# Patient Record
Sex: Female | Born: 1965 | Race: White | Hispanic: No | Marital: Married
Health system: Midwestern US, Academic
[De-identification: ages and names within clinical notes are randomized; demographics above are authoritative.]

---

## 2017-07-28 ENCOUNTER — Ambulatory Visit: Admit: 2017-07-28 | Discharge: 2017-07-28 | Payer: MEDICARE

## 2017-07-28 ENCOUNTER — Encounter: Admit: 2017-07-28 | Discharge: 2017-07-28 | Payer: MEDICARE

## 2017-07-28 DIAGNOSIS — M797 Fibromyalgia: ICD-10-CM

## 2017-07-28 DIAGNOSIS — G47411 Narcolepsy with cataplexy: ICD-10-CM

## 2017-07-28 DIAGNOSIS — M1990 Unspecified osteoarthritis, unspecified site: ICD-10-CM

## 2017-07-28 DIAGNOSIS — R42 Dizziness and giddiness: ICD-10-CM

## 2017-07-28 DIAGNOSIS — F410 Panic disorder [episodic paroxysmal anxiety] without agoraphobia: ICD-10-CM

## 2017-07-28 DIAGNOSIS — J302 Other seasonal allergic rhinitis: ICD-10-CM

## 2017-07-28 DIAGNOSIS — D539 Nutritional anemia, unspecified: ICD-10-CM

## 2017-07-28 DIAGNOSIS — T148XXA Other injury of unspecified body region, initial encounter: ICD-10-CM

## 2017-07-28 DIAGNOSIS — F419 Anxiety disorder, unspecified: ICD-10-CM

## 2017-07-28 DIAGNOSIS — G4733 Obstructive sleep apnea (adult) (pediatric): ICD-10-CM

## 2017-07-28 DIAGNOSIS — K219 Gastro-esophageal reflux disease without esophagitis: ICD-10-CM

## 2017-07-28 DIAGNOSIS — H8109 Meniere's disease, unspecified ear: Principal | ICD-10-CM

## 2017-07-28 MED ORDER — ARMODAFINIL 250 MG PO TAB
250 mg | ORAL_TABLET | Freq: Every day | ORAL | 5 refills | Status: AC
Start: 2017-07-28 — End: 2018-02-06

## 2017-07-30 NOTE — Assessment & Plan Note
-  continue Xyrem 4.5 g twice a day (9 g total daily)  -Start Nuvigil 250 mg daily. Provided financial options to help reduce costs including good Rx, [employer] program, [employer] cares, and others.  -Discussed with patient the risks of operating a motor vehicle, heavy machinery, or dangerous equipment while sleep and advised to avoid until sleepiness has resolved/controlled.  -Continue to treat underlying obstructive sleep apnea.

## 2017-07-30 NOTE — Assessment & Plan Note
-  Continue CPAP therapy as prescribed.  -Adjust CPAP settings by either removing her ramp or possibly increasing her lower setting to either 9 or 10 for comfort.  --We discussed care and maintenance of machine, mask, accessories.  This includes:  -Every month: filters and mask liners (if appropriate)  -Every 3 months: new mask  -Every 6 months: new headgear, tubing, and humidifier chamber  -Discussed complications of untreated sleep apnea (predominately moderate-severe) including arrhythmia, CHF, MI, dementia, stroke, insulin resistance/DM2, and even death.  The patient voiced their understanding and all questions were answered.

## 2017-08-01 ENCOUNTER — Encounter: Admit: 2017-08-01 | Discharge: 2017-08-01 | Payer: MEDICARE

## 2017-08-01 DIAGNOSIS — D539 Nutritional anemia, unspecified: ICD-10-CM

## 2017-08-01 DIAGNOSIS — H8109 Meniere's disease, unspecified ear: Principal | ICD-10-CM

## 2017-08-01 DIAGNOSIS — T148XXA Other injury of unspecified body region, initial encounter: ICD-10-CM

## 2017-08-01 DIAGNOSIS — F410 Panic disorder [episodic paroxysmal anxiety] without agoraphobia: ICD-10-CM

## 2017-08-01 DIAGNOSIS — R42 Dizziness and giddiness: ICD-10-CM

## 2017-08-01 DIAGNOSIS — K219 Gastro-esophageal reflux disease without esophagitis: ICD-10-CM

## 2017-08-01 DIAGNOSIS — J302 Other seasonal allergic rhinitis: ICD-10-CM

## 2017-08-01 DIAGNOSIS — M797 Fibromyalgia: ICD-10-CM

## 2017-08-01 DIAGNOSIS — M1990 Unspecified osteoarthritis, unspecified site: ICD-10-CM

## 2017-08-01 DIAGNOSIS — G47411 Narcolepsy with cataplexy: ICD-10-CM

## 2017-08-01 DIAGNOSIS — F419 Anxiety disorder, unspecified: ICD-10-CM

## 2017-08-14 ENCOUNTER — Encounter: Admit: 2017-08-14 | Discharge: 2017-08-14 | Payer: MEDICARE

## 2017-08-14 MED ORDER — SODIUM OXYBATE 500 MG/ML PO SOLN
Freq: Every evening | ORAL | 5 refills | 30.00000 days | Status: AC
Start: 2017-08-14 — End: 2018-07-13

## 2017-08-14 NOTE — Telephone Encounter
-----   Message from Gardiner Barefoot, MD sent at 07/28/2017 11:17 AM CDT -----  On Xyrem, new patient for me, switch Xyrem to my  Name.

## 2017-08-14 NOTE — Telephone Encounter
Tammy MA will fax [facility] Program form to [facility] to switch pt to Dr. Andria Meuse as providing physician.

## 2017-08-15 ENCOUNTER — Encounter: Admit: 2017-08-15 | Discharge: 2017-08-15 | Payer: MEDICARE

## 2017-09-17 ENCOUNTER — Encounter: Admit: 2017-09-17 | Discharge: 2017-09-17 | Payer: MEDICARE

## 2017-09-19 ENCOUNTER — Encounter: Admit: 2017-09-19 | Discharge: 2017-09-19 | Payer: MEDICARE

## 2017-09-19 NOTE — Telephone Encounter
Tried to call patient regarding notification from Xyrem about experiencing an autoimmune disorder while taking Xyrem. I had sent an email through my chart to patient on 09/17/17, but have not heard from patient. I left a message asking the patient to call me regarding this matter.

## 2017-12-03 ENCOUNTER — Encounter: Admit: 2017-12-03 | Discharge: 2017-12-03 | Payer: MEDICARE

## 2018-01-23 ENCOUNTER — Encounter: Admit: 2018-01-23 | Discharge: 2018-01-23 | Payer: MEDICARE

## 2018-02-06 ENCOUNTER — Encounter: Admit: 2018-02-06 | Discharge: 2018-02-06 | Payer: MEDICARE

## 2018-02-06 MED ORDER — ARMODAFINIL 250 MG PO TAB
250 mg | ORAL_TABLET | Freq: Every day | ORAL | 5 refills | Status: AC
Start: 2018-02-06 — End: 2018-08-25

## 2018-03-19 ENCOUNTER — Encounter: Admit: 2018-03-19 | Discharge: 2018-03-19 | Payer: MEDICARE

## 2018-04-17 ENCOUNTER — Encounter: Admit: 2018-04-17 | Discharge: 2018-04-17 | Payer: MEDICARE

## 2018-07-09 ENCOUNTER — Encounter: Admit: 2018-07-09 | Discharge: 2018-07-09 | Payer: MEDICARE

## 2018-07-13 ENCOUNTER — Encounter: Admit: 2018-07-13 | Discharge: 2018-07-13 | Payer: MEDICARE

## 2018-07-13 MED ORDER — SODIUM OXYBATE 500 MG/ML PO SOLN
Freq: Every evening | ORAL | 5 refills | Status: AC
Start: 2018-07-13 — End: 2018-12-17

## 2018-08-25 ENCOUNTER — Encounter: Admit: 2018-08-25 | Discharge: 2018-08-25 | Payer: MEDICARE

## 2018-08-25 MED ORDER — ARMODAFINIL 250 MG PO TAB
250 mg | ORAL_TABLET | Freq: Every day | ORAL | 5 refills | Status: AC
Start: 2018-08-25 — End: 2019-03-22

## 2018-11-30 ENCOUNTER — Encounter: Admit: 2018-11-30 | Discharge: 2018-11-30 | Payer: MEDICARE

## 2018-12-14 ENCOUNTER — Encounter: Admit: 2018-12-14 | Discharge: 2018-12-14 | Payer: MEDICARE

## 2018-12-16 ENCOUNTER — Ambulatory Visit: Admit: 2018-12-16 | Discharge: 2018-12-16 | Payer: MEDICARE

## 2018-12-16 ENCOUNTER — Encounter: Admit: 2018-12-16 | Discharge: 2018-12-16 | Payer: MEDICARE

## 2018-12-16 DIAGNOSIS — M1990 Unspecified osteoarthritis, unspecified site: Secondary | ICD-10-CM

## 2018-12-16 DIAGNOSIS — T148XXA Other injury of unspecified body region, initial encounter: Secondary | ICD-10-CM

## 2018-12-16 DIAGNOSIS — G47411 Narcolepsy with cataplexy: Secondary | ICD-10-CM

## 2018-12-16 DIAGNOSIS — K219 Gastro-esophageal reflux disease without esophagitis: Secondary | ICD-10-CM

## 2018-12-16 DIAGNOSIS — D539 Nutritional anemia, unspecified: Secondary | ICD-10-CM

## 2018-12-16 DIAGNOSIS — G4733 Obstructive sleep apnea (adult) (pediatric): Secondary | ICD-10-CM

## 2018-12-16 DIAGNOSIS — F419 Anxiety disorder, unspecified: Secondary | ICD-10-CM

## 2018-12-16 DIAGNOSIS — R42 Dizziness and giddiness: Secondary | ICD-10-CM

## 2018-12-16 DIAGNOSIS — M797 Fibromyalgia: Secondary | ICD-10-CM

## 2018-12-16 DIAGNOSIS — J302 Other seasonal allergic rhinitis: Secondary | ICD-10-CM

## 2018-12-16 DIAGNOSIS — H8109 Meniere's disease, unspecified ear: Secondary | ICD-10-CM

## 2018-12-16 DIAGNOSIS — F410 Panic disorder [episodic paroxysmal anxiety] without agoraphobia: Secondary | ICD-10-CM

## 2018-12-16 MED ORDER — ZALEPLON 5 MG PO CAP
5 mg | ORAL_CAPSULE | Freq: Every evening | ORAL | 5 refills | Status: AC
Start: 2018-12-16 — End: 2019-04-15

## 2018-12-17 ENCOUNTER — Encounter: Admit: 2018-12-17 | Discharge: 2018-12-17 | Payer: MEDICARE

## 2018-12-17 MED ORDER — SODIUM OXYBATE 500 MG/ML PO SOLN
Freq: Every evening | ORAL | 3 refills | 30.00000 days | Status: AC
Start: 2018-12-17 — End: 2019-05-26

## 2019-01-28 ENCOUNTER — Encounter: Admit: 2019-01-28 | Discharge: 2019-01-28 | Payer: MEDICARE

## 2019-03-19 ENCOUNTER — Encounter: Admit: 2019-03-19 | Discharge: 2019-03-19 | Payer: MEDICARE

## 2019-03-22 MED ORDER — ARMODAFINIL 250 MG PO TAB
ORAL_TABLET | Freq: Every day | 4 refills | Status: DC
Start: 2019-03-22 — End: 2019-05-29

## 2019-04-14 ENCOUNTER — Encounter: Admit: 2019-04-14 | Discharge: 2019-04-14 | Payer: MEDICARE

## 2019-04-15 ENCOUNTER — Encounter: Admit: 2019-04-15 | Discharge: 2019-04-15 | Payer: MEDICARE

## 2019-04-15 ENCOUNTER — Ambulatory Visit: Admit: 2019-04-15 | Discharge: 2019-04-16 | Payer: MEDICARE

## 2019-04-15 DIAGNOSIS — G4733 Obstructive sleep apnea (adult) (pediatric): Principal | ICD-10-CM

## 2019-04-15 DIAGNOSIS — K219 Gastro-esophageal reflux disease without esophagitis: ICD-10-CM

## 2019-04-15 DIAGNOSIS — F410 Panic disorder [episodic paroxysmal anxiety] without agoraphobia: ICD-10-CM

## 2019-04-15 DIAGNOSIS — M1990 Unspecified osteoarthritis, unspecified site: ICD-10-CM

## 2019-04-15 DIAGNOSIS — H8109 Meniere's disease, unspecified ear: Principal | ICD-10-CM

## 2019-04-15 DIAGNOSIS — R42 Dizziness and giddiness: ICD-10-CM

## 2019-04-15 DIAGNOSIS — G47411 Narcolepsy with cataplexy: ICD-10-CM

## 2019-04-15 DIAGNOSIS — J302 Other seasonal allergic rhinitis: ICD-10-CM

## 2019-04-15 DIAGNOSIS — D539 Nutritional anemia, unspecified: ICD-10-CM

## 2019-04-15 DIAGNOSIS — M797 Fibromyalgia: ICD-10-CM

## 2019-04-15 DIAGNOSIS — F419 Anxiety disorder, unspecified: ICD-10-CM

## 2019-04-15 DIAGNOSIS — T148XXA Other injury of unspecified body region, initial encounter: ICD-10-CM

## 2019-04-15 MED ORDER — SOLRIAMFETOL 150 MG PO TAB
150 mg | ORAL_TABLET | Freq: Every morning | ORAL | 5 refills | Status: DC
Start: 2019-04-15 — End: 2020-01-20

## 2019-04-15 NOTE — Progress Notes
???   Diclofenac Sodium 3 % gel Apply to base of thumbs and balls of feet three times daily.   ??? DULoxetine DR (CYMBALTA) 60 mg capsule Take 60 mg by mouth twice daily.   ??? fluticasone propionate (FLONASE) 50 mcg/actuation nasal spray, suspension Apply 2 sprays to each nostril as directed twice daily. Shake bottle gently before using.   ??? furosemide (LASIX) 20 mg tablet Take 20 mg by mouth every morning.   ??? L.acid/L.casei/B.bif/B.lon/FOS (PROBIOTIC BLEND PO) Take  by mouth daily.   ??? MULTIVITAMIN PO Take  by mouth daily.   ??? Sodium Oxybate (XYREM) 500 mg/mL soln Take 4.5 gm at bedtime, 2-4 hours later take 4.5 gm.  Indications: Muscle Weakness associated with Sleeping Disease   ??? solriamfetoL (SUNOSI) 150 mg oral tablet Take one tablet by mouth every morning. Indications: recurring sleep episodes during the day     Vitals:    04/15/19 1044   BP: (!) 150/80   Pulse: 80   Weight: 110.7 kg (244 lb)   Height: 160 cm (63)   PainSc: Zero     Body mass index is 43.22 kg/m???.     Physical Exam    General: Alert, cooperative, voices no distress    Neurological examination:  ???  Mental status: Alert, oriented to time, person, place and situation   Speech: Normal fluency, comprehension, articulation, repetition and naming. Fund of knowledge was age appropriate.         Assessment and Plan:    *The patient's Epworth Sleepiness Scale Score is 7/24.  STOPBANG Score: 5  If score > 3 there is a high probability that they have OSA.  Depression Screening was performed on [employer] in clinic today. Based on the score of 4, I provided support in the office today via active listening and general counseling and educated her to call if symptoms worsen or seek immediate help if needed.      Problem   Narcolepsy With Cataplexy    -Xyrem 4.5 g twice a day     Osa (Obstructive Sleep Apnea)     AHI 7.5 diagnosed 2015  -Auto CPAP 8-16 cm H2O  -100% compliance with residual AHI <1            OSA (obstructive sleep apnea)

## 2019-04-15 NOTE — Assessment & Plan Note
Ms. Sherrill continues to benefit from Xyrem 4.5 grams twice per night. She is not experiencing any side effects. She is not having any difficulty falling asleep.     Armodafinil 250 mg currently is not benefiting her as she has become very tired and fatigued during the day, like a slough.    Finances are a concern. She did not want to try [facility] as she already gets Xyrem via Patient Assistance and did not want to pursue this avenue.     Start Sunosi 150 mg daily.     She will discontinue armodafinil when this starts.

## 2019-05-26 ENCOUNTER — Ambulatory Visit: Admit: 2019-05-26 | Discharge: 2019-05-27

## 2019-05-26 ENCOUNTER — Encounter: Admit: 2019-05-26 | Discharge: 2019-05-26

## 2019-05-26 DIAGNOSIS — M797 Fibromyalgia: Secondary | ICD-10-CM

## 2019-05-26 DIAGNOSIS — K219 Gastro-esophageal reflux disease without esophagitis: Secondary | ICD-10-CM

## 2019-05-26 DIAGNOSIS — F410 Panic disorder [episodic paroxysmal anxiety] without agoraphobia: Secondary | ICD-10-CM

## 2019-05-26 DIAGNOSIS — G4733 Obstructive sleep apnea (adult) (pediatric): Secondary | ICD-10-CM

## 2019-05-26 DIAGNOSIS — M1990 Unspecified osteoarthritis, unspecified site: Secondary | ICD-10-CM

## 2019-05-26 DIAGNOSIS — H8109 Meniere's disease, unspecified ear: Secondary | ICD-10-CM

## 2019-05-26 DIAGNOSIS — R42 Dizziness and giddiness: Secondary | ICD-10-CM

## 2019-05-26 DIAGNOSIS — G47411 Narcolepsy with cataplexy: Secondary | ICD-10-CM

## 2019-05-26 DIAGNOSIS — D539 Nutritional anemia, unspecified: Secondary | ICD-10-CM

## 2019-05-26 DIAGNOSIS — F419 Anxiety disorder, unspecified: Secondary | ICD-10-CM

## 2019-05-26 DIAGNOSIS — T148XXA Other injury of unspecified body region, initial encounter: Secondary | ICD-10-CM

## 2019-05-26 DIAGNOSIS — J302 Other seasonal allergic rhinitis: Secondary | ICD-10-CM

## 2019-05-26 MED ORDER — DEXTROAMPHETAMINE-AMPHETAMINE 20 MG PO TAB
20 mg | ORAL_TABLET | Freq: Two times a day (BID) | ORAL | 0 refills | Status: DC
Start: 2019-05-26 — End: 2019-07-07

## 2019-05-26 MED ORDER — TRAZODONE 50 MG PO TAB
ORAL_TABLET | 2 refills | Status: DC
Start: 2019-05-26 — End: 2019-07-07

## 2019-05-26 NOTE — Assessment & Plan Note
The patient continues to benefit from CPAP with more consolidated sleep, elimination of snoring and improved daytime alertness.    We discussed care and maintenance of machine, mask, accessories.  This includes:  Every month: filters and mask liners (if appropriate)  Every 3 months new mask  Every 6 months new headgear  Download shows compliance and effective treatment.

## 2019-05-26 NOTE — Progress Notes
Date of Service: 05/26/2019    Subjective:             Angela Melton is a 53 y.o. female.  Obtained patient's, or patient proxy's, verbal consent to treat them and their agreement to [facility] financial policy and NPP via this telehealth visit during the [employer] Emergency  Phone: 15 min    History of Present Illness  Angela Melton is a 53 year old woman with obstructive sleep apnea and narcolepsy with cataplexy who presents via a telephone encounter for a follow-up appointment. With regards to her OSA, she is doing very well. She is not having any issues with supplies and her husband has not told her about any snoring. She sometimes struggles with the fit of the mask at night and has to readjust it. With regards to her narcolepsy, since her last appointment, Xyrem was discontinued due to shakiness and tremor. Without the Xyrem, she does not feel that her sleep is as restful. Her husband has also noted more talking in her sleep and and she is having more vivid dreams. For daytime sleepiness, she was started on Sunosi and Nuvigil was discontinued. Overall, she feels like things have not improved significantly with regards to her day time sleepiness with this transition. She takes the Sunosi around 8 am and by 11am or noon, she feels like she has to take a nap. She will sleep for 45 minutes or an hour and then does okay for the rest of the day. With the Nuvigil, she would do well in the morning, but in the afternoon, she would become tired and take a much longer nap than she does around noon with the [facility]. She has never tried stimulants in the past. She does feel that she has had good improvement in her cataplexy with the Sunosi. She is now only having episodes about twice per week. She is able to tell they are coming on so she sits down and rests.        Review of Systems   Constitutional: Positive for fatigue.   Respiratory: Positive for apnea.    Neurological: Positive for syncope. Psychiatric/Behavioral: Positive for dysphoric mood.   All other systems reviewed and are negative.        Objective:         ??? ACETAMINOPHEN (TYLENOL PO) Take  by mouth.   ??? amoxicillin-potassium clavulanate (AUGMENTIN) 875/125 mg tablet Take 1 tablet by mouth every 12 hours. Take with food.   ??? armodafiniL (NUVIGIL) 250 mg tablet Take one tablet by mouth daily.   ??? dextroamphetamine-amphetamine (ADDERALL) 20 mg tablet Take one tablet by mouth twice daily Indications: recurring sleep episodes during the day   ??? diazepam (VALIUM) 5 mg tablet Take 10 mg by mouth at bedtime daily.   ??? Diclofenac Sodium 3 % gel Apply to base of thumbs and balls of feet three times daily.   ??? DULoxetine DR (CYMBALTA) 60 mg capsule Take 60 mg by mouth twice daily.   ??? fluticasone propionate (FLONASE) 50 mcg/actuation nasal spray, suspension Apply 2 sprays to each nostril as directed twice daily. Shake bottle gently before using.   ??? furosemide (LASIX) 20 mg tablet Take 20 mg by mouth every morning.   ??? L.acid/L.casei/B.bif/B.lon/FOS (PROBIOTIC BLEND PO) Take  by mouth daily.   ??? MULTIVITAMIN PO Take  by mouth daily.   ??? solriamfetoL (SUNOSI) 150 mg oral tablet Take one tablet by mouth every morning. Indications: recurring sleep episodes during the day   ???  traZODone (DESYREL) 50 mg tablet 1/2-1 po q HS     Vitals:    05/26/19 1300   Weight: 115.7 kg (255 lb)   Height: 160 cm (63)   PainSc: Ten     Body mass index is 45.17 kg/m???.     Physical Exam  This was a telephone only encounter. No physical exam was done.        Assessment and Plan:  The patient's Epworth Sleepiness Scale Score is 9/24.     If score > 3 there is a high probability that they have OSA.  Depression Screening was performed on [employer] in clinic today. Based on the score of 0, no follow up action or recommendations are necessary at this time.  Discussed patient's BMI with her.  The body mass index is 45.17 kg/m???. and falls within the category of Obesity 3 (>40); specialist visit only, referred back to Primary Care Provider for follow up.    Problem   Narcolepsy With Cataplexy    -Current medications: Sunosi 150 mg daily  -Prior medications: Xyrem 4.5 g twice a day - discontinued due to tremors. Nuvigil 250 mg daily - discontinued due to decreased efficacy.      Osa (Obstructive Sleep Apnea)     AHI 7.5 diagnosed 2015  -Auto CPAP 8-16 cm H2O  -100% compliance with residual AHI <1            OSA (obstructive sleep apnea)  The patient continues to benefit from CPAP with more consolidated sleep, elimination of snoring and improved daytime alertness.    We discussed care and maintenance of machine, mask, accessories.  This includes:  Every month: filters and mask liners (if appropriate)  Every 3 months new mask  Every 6 months new headgear  Download shows compliance and effective treatment.          Narcolepsy with cataplexy  Sunosi - increase to 150 mg - 2 tabs daily   > Has had some difficulty getting an effective coupon so if affordability continues to be an issue, will plan to transition back to Nuvigil with hope that it will be effective again after a wash-out period  Adderall - Start at 20 mg BID to help with daytime wakefulness.   Trazodone - 25-50 mg qHS to help with more restful sleep since coming off of Xyrem  Cataplexy - continue Sunosi as above    I personally discussed the case in depth with the resident, and concur with resident documentation of history, physical assessment and treatment plan unless otherwise noted.  I reviewed the key history and examination points directly with the patient.    Try Sunosi x 2 per day, as one helps, would like to increase the dose.  If that is not possible, restart armodafinil.  Adderall - Potential side effects with stimulants include: headache, tremor, palpitations (strong, rapid heart beat), irritability among others.  All of these side effects can be worsened when caffeine is used at the same time as the stimulants.        Carollee Massed, M.D., D-ABSM  Director, Sleep Medicine Clinic

## 2019-05-28 ENCOUNTER — Encounter: Admit: 2019-05-28 | Discharge: 2019-05-28

## 2019-05-29 MED ORDER — ARMODAFINIL 250 MG PO TAB
250 mg | ORAL_TABLET | Freq: Every day | ORAL | 5 refills | Status: DC
Start: 2019-05-29 — End: 2019-05-31

## 2019-05-31 ENCOUNTER — Encounter: Admit: 2019-05-31 | Discharge: 2019-05-31

## 2019-05-31 MED ORDER — ARMODAFINIL 250 MG PO TAB
250 mg | ORAL_TABLET | Freq: Every day | ORAL | 5 refills | Status: DC
Start: 2019-05-31 — End: 2019-12-15

## 2019-05-31 NOTE — Telephone Encounter
Phone call from pt requesting Nuvigil be called to a different pharmacy due to cost. She is wanting Nuvigil called into [facility] in [place].  Refill to be routed to Dr Gerilyn Nestle for approval.

## 2019-06-06 ENCOUNTER — Encounter: Admit: 2019-06-06 | Discharge: 2019-06-06

## 2019-06-06 DIAGNOSIS — K219 Gastro-esophageal reflux disease without esophagitis: Secondary | ICD-10-CM

## 2019-06-06 DIAGNOSIS — H8109 Meniere's disease, unspecified ear: Secondary | ICD-10-CM

## 2019-06-06 DIAGNOSIS — T148XXA Other injury of unspecified body region, initial encounter: Secondary | ICD-10-CM

## 2019-06-06 DIAGNOSIS — F419 Anxiety disorder, unspecified: Secondary | ICD-10-CM

## 2019-06-06 DIAGNOSIS — D539 Nutritional anemia, unspecified: Secondary | ICD-10-CM

## 2019-06-06 DIAGNOSIS — F410 Panic disorder [episodic paroxysmal anxiety] without agoraphobia: Secondary | ICD-10-CM

## 2019-06-06 DIAGNOSIS — G47411 Narcolepsy with cataplexy: Secondary | ICD-10-CM

## 2019-06-06 DIAGNOSIS — J302 Other seasonal allergic rhinitis: Secondary | ICD-10-CM

## 2019-06-06 DIAGNOSIS — M797 Fibromyalgia: Secondary | ICD-10-CM

## 2019-06-06 DIAGNOSIS — M1990 Unspecified osteoarthritis, unspecified site: Secondary | ICD-10-CM

## 2019-06-06 DIAGNOSIS — R42 Dizziness and giddiness: Secondary | ICD-10-CM

## 2019-06-25 ENCOUNTER — Encounter: Admit: 2019-06-25 | Discharge: 2019-06-25

## 2019-06-25 MED ORDER — TRAZODONE 50 MG PO TAB
ORAL_TABLET | Freq: Every evening | 1 refills
Start: 2019-06-25 — End: ?

## 2019-06-25 NOTE — Telephone Encounter
Refill request for Trazodone. Per chart refilled prescriptions on 05/26/2019 with refills. This refill DENIED.

## 2019-07-06 ENCOUNTER — Encounter: Admit: 2019-07-06 | Discharge: 2019-07-06

## 2019-07-07 MED ORDER — TRAZODONE 50 MG PO TAB
ORAL_TABLET | 2 refills | Status: DC
Start: 2019-07-07 — End: 2019-11-17

## 2019-07-07 MED ORDER — DEXTROAMPHETAMINE-AMPHETAMINE 20 MG PO TAB
20 mg | ORAL_TABLET | Freq: Two times a day (BID) | ORAL | 0 refills | Status: DC
Start: 2019-07-07 — End: 2019-11-21

## 2019-07-07 MED ORDER — DEXTROAMPHETAMINE-AMPHETAMINE 20 MG PO TAB
20 mg | ORAL_TABLET | Freq: Two times a day (BID) | ORAL | 0 refills | Status: DC
Start: 2019-07-07 — End: 2020-04-06

## 2019-07-07 MED ORDER — DEXTROAMPHETAMINE-AMPHETAMINE 20 MG PO TAB
20 mg | ORAL_TABLET | Freq: Two times a day (BID) | ORAL | 0 refills | Status: DC
Start: 2019-07-07 — End: 2020-02-08

## 2019-11-17 ENCOUNTER — Encounter: Admit: 2019-11-17 | Discharge: 2019-11-17 | Payer: MEDICARE

## 2019-11-17 MED ORDER — TRAZODONE 50 MG PO TAB
ORAL_TABLET | Freq: Every evening | 2 refills | Status: DC
Start: 2019-11-17 — End: 2020-05-11

## 2019-11-17 NOTE — Telephone Encounter
Refill request for Trazodone. Per protocol refill approved and sent to pharmacy

## 2019-11-19 ENCOUNTER — Encounter: Admit: 2019-11-19 | Discharge: 2019-11-19 | Payer: MEDICARE

## 2019-11-19 NOTE — Telephone Encounter
Refill request for Adderall. Per protocol refill routed to Dr Stevens for approval.

## 2019-11-21 MED ORDER — DEXTROAMPHETAMINE-AMPHETAMINE 20 MG PO TAB
20 mg | ORAL_TABLET | Freq: Two times a day (BID) | ORAL | 0 refills | Status: DC
Start: 2019-11-21 — End: 2020-02-08

## 2019-12-15 ENCOUNTER — Encounter: Admit: 2019-12-15 | Discharge: 2019-12-15 | Payer: MEDICARE

## 2019-12-15 MED ORDER — ARMODAFINIL 250 MG PO TAB
250 mg | ORAL_TABLET | Freq: Every day | ORAL | 1 refills | Status: DC
Start: 2019-12-15 — End: 2019-12-16

## 2019-12-15 NOTE — Telephone Encounter
Refill request for Armodafinil. Per protocol pt needs follow up appointment. Per Dr Andria Meuse give enough until follow appointment is made. Pt notified

## 2019-12-16 ENCOUNTER — Encounter: Admit: 2019-12-16 | Discharge: 2019-12-16 | Payer: MEDICARE

## 2019-12-16 MED ORDER — ARMODAFINIL 250 MG PO TAB
250 mg | ORAL_TABLET | Freq: Every day | ORAL | 1 refills | Status: DC
Start: 2019-12-16 — End: 2020-01-20

## 2019-12-16 NOTE — Telephone Encounter
Phone call from pt requesting refill sent to pharmacy yesterday (12/15/2019) needs to go to a different pharmacy [facility]. To route request to Dr Andria Meuse for approval

## 2020-01-20 ENCOUNTER — Encounter: Admit: 2020-01-20 | Discharge: 2020-01-20 | Payer: MEDICARE

## 2020-01-20 DIAGNOSIS — G47411 Narcolepsy with cataplexy: Secondary | ICD-10-CM

## 2020-01-20 DIAGNOSIS — D539 Nutritional anemia, unspecified: Secondary | ICD-10-CM

## 2020-01-20 DIAGNOSIS — G4733 Obstructive sleep apnea (adult) (pediatric): Secondary | ICD-10-CM

## 2020-01-20 DIAGNOSIS — T148XXA Other injury of unspecified body region, initial encounter: Secondary | ICD-10-CM

## 2020-01-20 DIAGNOSIS — M797 Fibromyalgia: Secondary | ICD-10-CM

## 2020-01-20 DIAGNOSIS — H8109 Meniere's disease, unspecified ear: Secondary | ICD-10-CM

## 2020-01-20 DIAGNOSIS — M1990 Unspecified osteoarthritis, unspecified site: Secondary | ICD-10-CM

## 2020-01-20 DIAGNOSIS — J302 Other seasonal allergic rhinitis: Secondary | ICD-10-CM

## 2020-01-20 DIAGNOSIS — F419 Anxiety disorder, unspecified: Secondary | ICD-10-CM

## 2020-01-20 DIAGNOSIS — K219 Gastro-esophageal reflux disease without esophagitis: Secondary | ICD-10-CM

## 2020-01-20 DIAGNOSIS — R42 Dizziness and giddiness: Secondary | ICD-10-CM

## 2020-01-20 DIAGNOSIS — F410 Panic disorder [episodic paroxysmal anxiety] without agoraphobia: Secondary | ICD-10-CM

## 2020-01-20 MED ORDER — TOPIRAMATE 50 MG PO TAB
25-50 mg | ORAL_TABLET | Freq: Every evening | ORAL | 5 refills | Status: AC
Start: 2020-01-20 — End: ?

## 2020-01-20 MED ORDER — ARMODAFINIL 250 MG PO TAB
250 mg | ORAL_TABLET | Freq: Every day | ORAL | 5 refills | Status: AC
Start: 2020-01-20 — End: ?

## 2020-01-23 ENCOUNTER — Encounter: Admit: 2020-01-23 | Discharge: 2020-01-23 | Payer: MEDICARE

## 2020-01-23 DIAGNOSIS — D539 Nutritional anemia, unspecified: Secondary | ICD-10-CM

## 2020-01-23 DIAGNOSIS — M1990 Unspecified osteoarthritis, unspecified site: Secondary | ICD-10-CM

## 2020-01-23 DIAGNOSIS — J302 Other seasonal allergic rhinitis: Secondary | ICD-10-CM

## 2020-01-23 DIAGNOSIS — M797 Fibromyalgia: Secondary | ICD-10-CM

## 2020-01-23 DIAGNOSIS — R42 Dizziness and giddiness: Secondary | ICD-10-CM

## 2020-01-23 DIAGNOSIS — F410 Panic disorder [episodic paroxysmal anxiety] without agoraphobia: Secondary | ICD-10-CM

## 2020-01-23 DIAGNOSIS — G47411 Narcolepsy with cataplexy: Secondary | ICD-10-CM

## 2020-01-23 DIAGNOSIS — K219 Gastro-esophageal reflux disease without esophagitis: Secondary | ICD-10-CM

## 2020-01-23 DIAGNOSIS — H8109 Meniere's disease, unspecified ear: Secondary | ICD-10-CM

## 2020-01-23 DIAGNOSIS — T148XXA Other injury of unspecified body region, initial encounter: Secondary | ICD-10-CM

## 2020-01-23 DIAGNOSIS — F419 Anxiety disorder, unspecified: Secondary | ICD-10-CM

## 2020-02-03 ENCOUNTER — Encounter: Admit: 2020-02-03 | Discharge: 2020-02-03 | Payer: MEDICARE

## 2020-02-04 ENCOUNTER — Encounter: Admit: 2020-02-04 | Discharge: 2020-02-04 | Payer: MEDICARE

## 2020-02-04 NOTE — Telephone Encounter
LM for RN of Dr. Araceli Bouche at [facility] requesting MRA report be faxed to Dr. Junius Roads office at [phone].

## 2020-02-08 ENCOUNTER — Encounter: Admit: 2020-02-08 | Discharge: 2020-02-08 | Payer: MEDICARE

## 2020-02-08 ENCOUNTER — Ambulatory Visit: Admit: 2020-02-08 | Discharge: 2020-02-09 | Payer: MEDICARE

## 2020-02-09 ENCOUNTER — Encounter: Admit: 2020-02-09 | Discharge: 2020-02-09 | Payer: MEDICARE

## 2020-02-09 NOTE — Telephone Encounter
-----   Message from Dolphus Jenny sent at 02/09/2020 11:15 AM CST -----  Regarding: Update  Patient scheduled to see Dr. Geronimo Boot on 04/06   Please request additional medical records [place], [place], Ph- [phone], Fax [phone]   And records from [facility] - patient had recent ED visit- not sure if records will be in care Everywhere.   External cardiac care over 5 years ago.     Thank you.   ----- Message -----  From: Docia Chuck  Sent: 02/07/2020   9:46 AM CST  To: Ester Rink, #  Subject: Requesting medical records                       Please request additional medical records [facility], [place], [phone], Fax [phone] medication list and visit notes received.  No appointment is scheduled at this time.    Thank you.

## 2020-02-25 ENCOUNTER — Encounter: Admit: 2020-02-25 | Discharge: 2020-02-25 | Payer: MEDICARE

## 2020-02-25 NOTE — Telephone Encounter
Phone call from pt concerned of recent bouts of not being able to stay awake during the day. She is wanting to take Nuvigil and both doses of Adderall together. Informed pt to let me consult with Dr Andria Meuse to get approval of that request. Will consult with Dr Andria Meuse upon her return to the clinic on Monday.

## 2020-03-07 ENCOUNTER — Encounter: Admit: 2020-03-07 | Discharge: 2020-03-07 | Payer: MEDICARE

## 2020-04-06 ENCOUNTER — Encounter: Admit: 2020-04-06 | Discharge: 2020-04-06 | Payer: MEDICARE

## 2020-04-06 MED ORDER — DEXTROAMPHETAMINE-AMPHETAMINE 20 MG PO TAB
20 mg | ORAL_TABLET | Freq: Two times a day (BID) | ORAL | 0 refills | Status: AC
Start: 2020-04-06 — End: ?

## 2020-04-06 NOTE — Telephone Encounter
Refill request for Adderall. Per protocol refill routed to Dr Stevens for approval.

## 2020-05-10 ENCOUNTER — Encounter: Admit: 2020-05-10 | Discharge: 2020-05-10 | Payer: MEDICARE

## 2020-05-11 MED ORDER — TRAZODONE 50 MG PO TAB
ORAL_TABLET | Freq: Every evening | 3 refills | Status: AC
Start: 2020-05-11 — End: ?

## 2020-05-15 ENCOUNTER — Encounter: Admit: 2020-05-15 | Discharge: 2020-05-15 | Payer: MEDICARE

## 2020-07-27 ENCOUNTER — Encounter: Admit: 2020-07-27 | Discharge: 2020-07-27 | Payer: MEDICARE

## 2020-07-27 MED ORDER — ARMODAFINIL 250 MG PO TAB
250 mg | ORAL_TABLET | Freq: Every day | ORAL | 5 refills | Status: AC
Start: 2020-07-27 — End: ?

## 2020-07-27 NOTE — Telephone Encounter
Refill request for Armodafinil. Per protocol refill routed to Dr Stevens for approval.

## 2020-09-01 ENCOUNTER — Encounter: Admit: 2020-09-01 | Discharge: 2020-09-01 | Payer: MEDICARE

## 2020-09-01 MED ORDER — TOPIRAMATE 50 MG PO TAB
ORAL_TABLET | Freq: Every evening | 5 refills | Status: AC
Start: 2020-09-01 — End: ?

## 2020-09-01 NOTE — Telephone Encounter
Refill request for Topamax. Per protocol refill approved and sent to pharmacy

## 2020-09-10 ENCOUNTER — Encounter: Admit: 2020-09-10 | Discharge: 2020-09-10 | Payer: MEDICARE

## 2020-09-10 DIAGNOSIS — F419 Anxiety disorder, unspecified: Secondary | ICD-10-CM

## 2020-09-10 DIAGNOSIS — M1990 Unspecified osteoarthritis, unspecified site: Secondary | ICD-10-CM

## 2020-09-10 DIAGNOSIS — G47411 Narcolepsy with cataplexy: Secondary | ICD-10-CM

## 2020-09-10 DIAGNOSIS — K219 Gastro-esophageal reflux disease without esophagitis: Secondary | ICD-10-CM

## 2020-09-10 DIAGNOSIS — H8109 Meniere's disease, unspecified ear: Secondary | ICD-10-CM

## 2020-09-10 DIAGNOSIS — F410 Panic disorder [episodic paroxysmal anxiety] without agoraphobia: Secondary | ICD-10-CM

## 2020-09-10 DIAGNOSIS — M797 Fibromyalgia: Secondary | ICD-10-CM

## 2020-09-10 DIAGNOSIS — R42 Dizziness and giddiness: Secondary | ICD-10-CM

## 2020-09-10 DIAGNOSIS — T148XXA Other injury of unspecified body region, initial encounter: Secondary | ICD-10-CM

## 2020-09-10 DIAGNOSIS — D539 Nutritional anemia, unspecified: Secondary | ICD-10-CM

## 2020-09-10 DIAGNOSIS — J302 Other seasonal allergic rhinitis: Secondary | ICD-10-CM

## 2020-10-02 ENCOUNTER — Encounter: Admit: 2020-10-02 | Discharge: 2020-10-02 | Payer: MEDICARE

## 2020-10-02 MED ORDER — TRAZODONE 50 MG PO TAB
ORAL_TABLET | Freq: Every evening | 3 refills | Status: AC
Start: 2020-10-02 — End: ?

## 2020-10-02 NOTE — Telephone Encounter
Refill request for Trazodone. Per protocol refill approved and sent to pharmacy.

## 2020-11-12 ENCOUNTER — Encounter: Admit: 2020-11-12 | Discharge: 2020-11-12 | Payer: MEDICARE

## 2020-11-13 ENCOUNTER — Encounter: Admit: 2020-08-28 | Discharge: 2020-08-28 | Payer: MEDICARE

## 2020-11-29 ENCOUNTER — Encounter: Admit: 2020-11-29 | Discharge: 2020-11-29 | Payer: MEDICARE

## 2020-11-29 DIAGNOSIS — R0602 Shortness of breath: Secondary | ICD-10-CM

## 2020-12-13 ENCOUNTER — Encounter: Admit: 2020-12-13 | Discharge: 2020-12-13 | Payer: MEDICARE

## 2020-12-13 NOTE — Telephone Encounter
Authorization was received.

## 2020-12-13 NOTE — Telephone Encounter
Notification received from pharmacy that Armodafinil needed prior authorization.  PA started via Cover My Meds.    PA Case ID: [id number]   Key:  [id number]

## 2021-01-15 ENCOUNTER — Encounter

## 2021-01-15 DIAGNOSIS — T148XXA Other injury of unspecified body region, initial encounter: Secondary | ICD-10-CM

## 2021-01-15 DIAGNOSIS — G47411 Narcolepsy with cataplexy: Secondary | ICD-10-CM

## 2021-01-15 DIAGNOSIS — F410 Panic disorder [episodic paroxysmal anxiety] without agoraphobia: Secondary | ICD-10-CM

## 2021-01-15 DIAGNOSIS — F419 Anxiety disorder, unspecified: Secondary | ICD-10-CM

## 2021-01-15 DIAGNOSIS — R42 Dizziness and giddiness: Secondary | ICD-10-CM

## 2021-01-15 DIAGNOSIS — M797 Fibromyalgia: Secondary | ICD-10-CM

## 2021-01-15 DIAGNOSIS — D539 Nutritional anemia, unspecified: Secondary | ICD-10-CM

## 2021-01-15 DIAGNOSIS — J310 Chronic rhinitis: Secondary | ICD-10-CM

## 2021-01-15 DIAGNOSIS — H8109 Meniere's disease, unspecified ear: Secondary | ICD-10-CM

## 2021-01-15 DIAGNOSIS — J302 Other seasonal allergic rhinitis: Secondary | ICD-10-CM

## 2021-01-15 DIAGNOSIS — M1990 Unspecified osteoarthritis, unspecified site: Secondary | ICD-10-CM

## 2021-01-15 DIAGNOSIS — K219 Gastro-esophageal reflux disease without esophagitis: Secondary | ICD-10-CM

## 2021-01-15 MED ORDER — AZELASTINE 137 MCG (0.1 %) NA SPRA
2 | Freq: Two times a day (BID) | NASAL | 1 refills | 50.00000 days | Status: AC
Start: 2021-01-15 — End: ?

## 2021-01-15 NOTE — Progress Notes
Date of Service: 01/15/2021       Subjective:  I had the pleasure of seeing Angela Melton who presents today to the Adult Allergy & Immunology clinic at the [facility] for an Initial Visit.  She was referred by [facility].  The PCP is Dr.Huntington, Melissa.  As you know, she is a 55 y.o. female with hayfever.  This patient is accompanied in the office by her friend who provided the history.      Chief Complaint   Patient presents with   ? Allergies       History of Present Illness:     Hayfever:   Family history: Dad hayfever, sister asthma and eczema  Timeframe: Spring-fall, no symptoms during winter, No symptoms areound animals  Symptoms: itchy watery eye, nasal congestion, rhinorrhea, PND, hoarse voice, and sinus pain/fullness  Dog in the house, no access to bedroom  She has used various oral antihistamines medicines which are mildly helpful short term and then are no longer effective. Has used flonase intermittently with mild improvement. Uses eye drop which she finds helpful, unsure of which one.   No SPT for aero allergens in the past, no AIT      Adverse reaction to H1N1 vaccine:  Pt reports having quick onset hives with GI symptoms within 4-6 hours after receiving the H1N1 vaccine. Denies respiratory symptoms. Significant fatigue, weakness, difficulty drinking some of which are still present. Her memory of symptoms is spotty as she was not aware the weeks following receiving the vaccine. She did not have H1N1 testing and has been told the long term sequelae could be either from H1N1 or the vaccine. She has not undergone allergy testing specific to the vaccine. Denies receiving any vaccines since that time in 2009-2010. She is not interested to receive any vaccines at this time.      Pt has tolerated using Miralax in the past without symptoms.       Medical History:   Diagnosis Date   ? Anxiety    ? Arthritis    ? Cataplexy and narcolepsy 2016   ? Chronic GERD    ? Dizziness    ? Fibromyalgia    ? Hematoma    ? Meniere disease    ? Panic attack    ? Seasonal allergic reaction    ? Unspecified deficiency anemia      Surgical History:   Procedure Laterality Date   ? CARPAL TUNNEL RELEASE     ? HYSTERECTOMY     ? SHOULDER SURGERY     ? TONSILLECTOMY     ? TUBAL LIGATION     ? WISDOM TEETH EXTRACTION       Family History   Problem Relation Age of Onset   ? Aneurysm Rupture Mother         brain   ? High Cholesterol Mother    ? Hypertension Mother    ? Stroke Mother    ? Heart Attack Mother    ? Retinal Detachment Mother    ? Cancer Father         Throat Cancer (smoker)   ? High Cholesterol Father    ? Cataract Father    ? Glaucoma Father    ? Neurologic Disorder Father         Dementia   ? Stroke Sister    ? Autoimmune Disease Sister         Blood Clotting Disorder (Factor 8?)   ? Diabetes  Maternal Grandmother    ? Stroke Maternal Grandmother    ? Diabetes Paternal Grandmother    ? Stroke Maternal Uncle    ? Cancer Maternal Aunt         Breast Cancer   ? Macular Degen Neg Hx    ? Thyroid Disease Neg Hx    ? Strabismus Neg Hx    ? Amblyopia Neg Hx    ? Blindness Neg Hx    ? Coronary Artery Disease Neg Hx      Social History     Socioeconomic History   ? Marital status: Married     Spouse name: Not on file   ? Number of children: Not on file   ? Years of education: Not on file   ? Highest education level: Not on file   Occupational History   ? Not on file   Tobacco Use   ? Smoking status: Never Smoker   ? Smokeless tobacco: Never Used   Substance and Sexual Activity   ? Alcohol use: No   ? Drug use: No   ? Sexual activity: Yes     Partners: Male   Other Topics Concern   ? Not on file   Social History Narrative   ? Not on file                     Asthma Control Test  No data recorded     Rhinitis Control Assessment Test  No data recorded             Review of Systems   Constitutional: Positive for fatigue. Negative for activity change and fever.   HENT: Positive for congestion, postnasal drip, rhinorrhea, sinus pressure, sinus pain, sneezing and voice change. Negative for nosebleeds and sore throat.    Eyes: Positive for redness and itching.   Respiratory: Positive for shortness of breath. Negative for cough, chest tightness and wheezing.    Gastrointestinal: Negative for constipation, diarrhea, nausea and vomiting.   Genitourinary: Positive for difficulty urinating.   Skin: Negative for rash.   Allergic/Immunologic: Negative for environmental allergies and food allergies.   Neurological: Positive for dizziness and weakness. Negative for headaches.   Hematological: Negative for adenopathy.       A 10 point review of systems has been reviewed and the remainder are all negative other than as noted above and as in the HPI.  Any chronic issues are being addressed by a variety of physicians other than as is noted in the HPI.      Objective:         Medications  ? ACETAMINOPHEN (TYLENOL PO) Take  by mouth.   ? armodafiniL (NUVIGIL) 250 mg tablet TAKE ONE TABLET BY MOUTH DAILY   ? dextroamphetamine-amphetamine (ADDERALL) 20 mg tablet Take one tablet by mouth twice daily Indications: recurring sleep episodes during the day   ? diazepam (VALIUM) 5 mg tablet Take 10 mg by mouth at bedtime daily.   ? Diclofenac Sodium 3 % gel Apply to base of thumbs and balls of feet three times daily.   ? DULoxetine DR (CYMBALTA) 60 mg capsule Take 60 mg by mouth twice daily.   ? fluticasone propionate (FLONASE) 50 mcg/actuation nasal spray, suspension Apply 2 sprays to each nostril as directed daily. Shake bottle gently before using.    ? furosemide (LASIX) 20 mg tablet Take 20 mg by mouth every morning. 2 PO Q am and 1 PO QHS   ? sodium  chloride (MURO 128) 5 % ophthalmic solution Place 1 drop into or around eye(s) every 3 hours.   ? topiramate (TOPAMAX) 50 mg tablet TAKE 1/2 TO 1 TABLET BY MOUTH EVERY NIGHT AT BEDTIME   ? traZODone (DESYREL) 50 mg tablet TAKE ONE-HALF OR ONE TABLET BY MOUTH EVERY NIGHT AT BEDTIME     Allergies Allergen Reactions   ? Latex RASH   ? Lipitor [Atorvastatin] CHEST TIGHTNESS, MUSCLE PAIN and NAUSEA AND VOMITING   ? Codeine ITCHING   ? Morphine MENTAL STATUS CHANGES   ? Flu Vaccine [Influenza Virus Vaccines] SEE COMMENTS     They thought I had Guillain-Barre.      Vitals:    01/15/21 1301   BP: 123/60   Pulse: 105   Temp: 36.1 ?C (97 ?F)   SpO2: 98%   Weight: 124.7 kg (275 lb)   Height: 160 cm (5' 3)   PainSc: Seven       Physical Exam  Vitals and nursing note reviewed.   Constitutional:       General: She is not in acute distress.     Appearance: Normal appearance. She is obese. She is not ill-appearing, toxic-appearing or diaphoretic.      Interventions: Nasal cannula in place.   HENT:      Head: Normocephalic and atraumatic.      Right Ear: Tympanic membrane and external ear normal.      Left Ear: Tympanic membrane and external ear normal.      Nose: Nose normal. No congestion or rhinorrhea.      Mouth/Throat:      Mouth: Mucous membranes are moist. No angioedema.      Pharynx: No oropharyngeal exudate or posterior oropharyngeal erythema.   Eyes:      General: No scleral icterus.        Right eye: No discharge.         Left eye: No discharge.      Extraocular Movements: Extraocular movements intact.      Conjunctiva/sclera: Conjunctivae normal.   Cardiovascular:      Rate and Rhythm: Normal rate and regular rhythm.      Pulses: Normal pulses.      Heart sounds: Normal heart sounds. No murmur heard.  Pulmonary:      Effort: Pulmonary effort is normal. No respiratory distress.      Breath sounds: Normal breath sounds. No wheezing or rhonchi.   Musculoskeletal:      Cervical back: Normal range of motion and neck supple. No muscular tenderness.   Lymphadenopathy:      Cervical: No cervical adenopathy.   Skin:     General: Skin is warm and dry.      Capillary Refill: Capillary refill takes less than 2 seconds.      Coloration: Skin is not jaundiced.      Findings: No bruising, erythema or rash. Neurological:      Mental Status: She is alert.   Psychiatric:         Behavior: Behavior is cooperative.            Assessment and Plan:  Problem   Rhinoconjunctivitis - spring through fall    Tri-seasonal, spring-fall  Watery, itchy eye, itchy nose, rhinorrhea, nasal congestion, sneezing, sinus pain/pressure/infections    Oral antihistamines have provided mild benefits in the past  No nose spray  Eye drops helpful       Rhinoconjunctivitis - spring through fall  - continue Flonase 2 spen daily,  proper technique demonstrated  - start azelastine 1-2 spen 1-2 times per day PRN  - continue to use anti-histmaine eye drops prn, or Pataday 0.7% 1 drop per eye daily  - can consider oral antihistamines if cr is acceptable given 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 Our testing would not be able to explain why the patient is continuing to suffer from long-term sequelae or help in accurately identifying if the H1N1 vaccine contributed to her symptoms.     Follow-up visit in 6 weeks, or sooner as needed. Telemedicine.     Seen and discussed with Dr. Hyman Bower who is in agreement of assessment and plan.     Thank you for allowing [place] to participate in this patient's care.  Please feel free to contact us with any questions or concerns.    Dr. Cecile Sheerer, DO  Allergy/Immunology Fellow  Division of Allergy, Immunology, and Rheumatology  Department of Internal Medicine and Department of Pediatrics  [facility]

## 2021-01-16 ENCOUNTER — Encounter

## 2021-01-16 DIAGNOSIS — M797 Fibromyalgia: Secondary | ICD-10-CM

## 2021-01-16 DIAGNOSIS — G47411 Narcolepsy with cataplexy: Secondary | ICD-10-CM

## 2021-01-16 DIAGNOSIS — M1990 Unspecified osteoarthritis, unspecified site: Secondary | ICD-10-CM

## 2021-01-16 DIAGNOSIS — F419 Anxiety disorder, unspecified: Secondary | ICD-10-CM

## 2021-01-16 DIAGNOSIS — K219 Gastro-esophageal reflux disease without esophagitis: Secondary | ICD-10-CM

## 2021-01-16 DIAGNOSIS — R42 Dizziness and giddiness: Secondary | ICD-10-CM

## 2021-01-16 DIAGNOSIS — H8109 Meniere's disease, unspecified ear: Secondary | ICD-10-CM

## 2021-01-16 DIAGNOSIS — D539 Nutritional anemia, unspecified: Secondary | ICD-10-CM

## 2021-01-16 DIAGNOSIS — T148XXA Other injury of unspecified body region, initial encounter: Secondary | ICD-10-CM

## 2021-01-16 DIAGNOSIS — F410 Panic disorder [episodic paroxysmal anxiety] without agoraphobia: Secondary | ICD-10-CM

## 2021-01-16 DIAGNOSIS — J302 Other seasonal allergic rhinitis: Secondary | ICD-10-CM

## 2021-02-13 ENCOUNTER — Encounter: Admit: 2021-02-13 | Discharge: 2021-02-13 | Payer: MEDICARE

## 2021-02-16 ENCOUNTER — Encounter: Admit: 2021-02-16 | Discharge: 2021-02-16 | Payer: MEDICARE

## 2021-02-16 NOTE — Telephone Encounter
Patient is transferring care to Dr. Stevens new office

## 2021-02-23 ENCOUNTER — Encounter: Admit: 2021-02-23 | Discharge: 2021-02-23 | Payer: MEDICARE

## 2021-06-18 ENCOUNTER — Encounter: Admit: 2021-06-18 | Discharge: 2021-06-18 | Payer: MEDICARE

## 2021-06-18 NOTE — Telephone Encounter
Denied refill for armodafinil. Pt last seen by Dr. Andria Meuse 01/20/20. Pt chose to not re-establish with Dr. Threasa Heads stating "issue resolved."

## 2021-06-27 ENCOUNTER — Encounter: Admit: 2021-06-27 | Discharge: 2021-06-27 | Payer: MEDICARE

## 2022-09-18 ENCOUNTER — Encounter: Admit: 2022-09-18 | Discharge: 2022-09-18 | Payer: MEDICARE

## 2022-12-17 ENCOUNTER — Encounter: Admit: 2022-12-17 | Discharge: 2022-12-17 | Payer: MEDICARE

## 2022-12-17 DIAGNOSIS — J849 Interstitial pulmonary disease, unspecified: Secondary | ICD-10-CM

## 2022-12-17 NOTE — Progress Notes
Received CT and PFT order request from pulm scheduling team for pt's initial visit. MA placed CT and PFT orders per algorithm instructions.   Srishti Strnad, MA

## 2023-03-10 ENCOUNTER — Encounter: Admit: 2023-03-10 | Discharge: 2023-03-10 | Payer: MEDICARE

## 2023-07-25 ENCOUNTER — Encounter: Admit: 2023-07-25 | Discharge: 2023-07-25 | Payer: MEDICARE

## 2023-11-16 IMAGING — US ABDLM
1 series · 14 of 25 positions shown · non-contrast
Comparison: none

[Series 1: us abdomen limited · 14 of 47 slices shown]
[im 1/47]
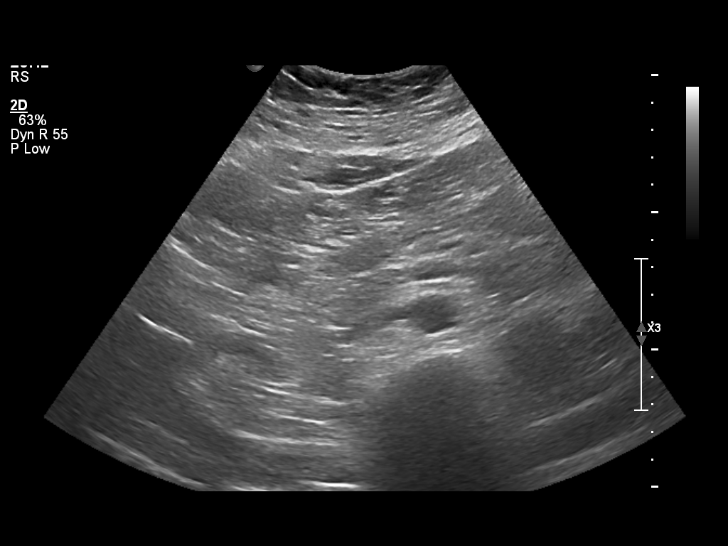
[im 4/47]
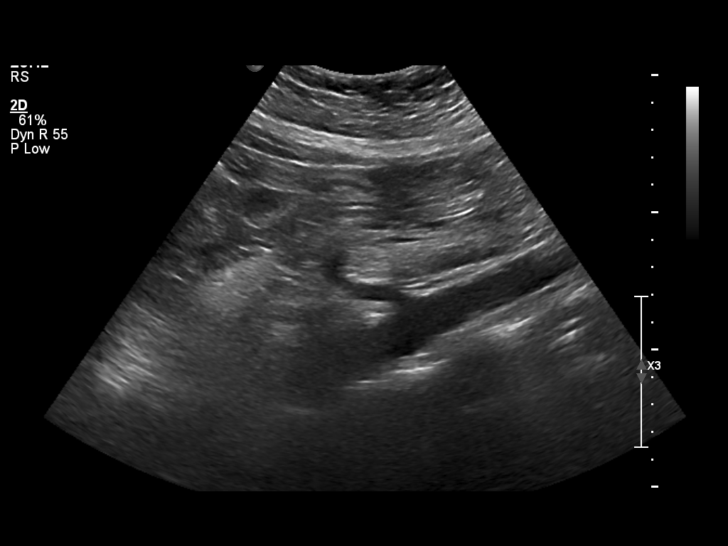
[im 8/47]
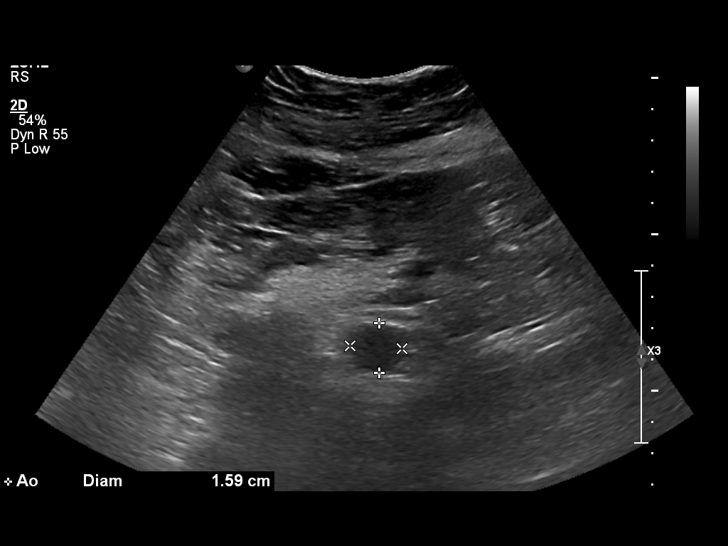
[im 12/47]
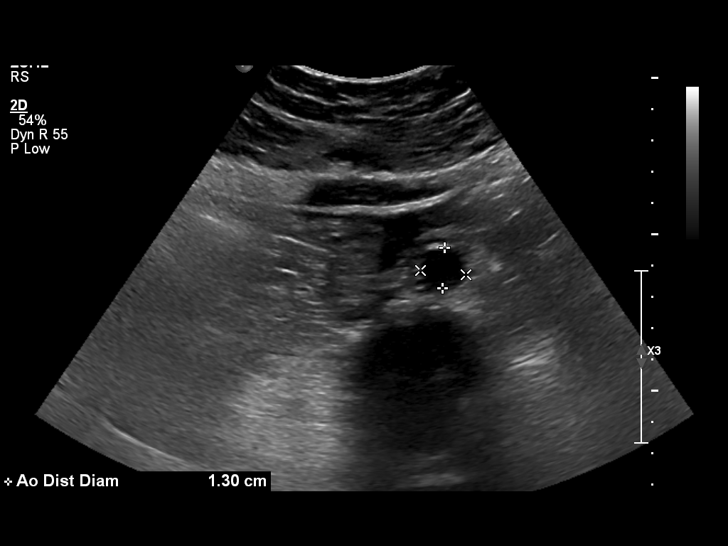
[im 16/47]
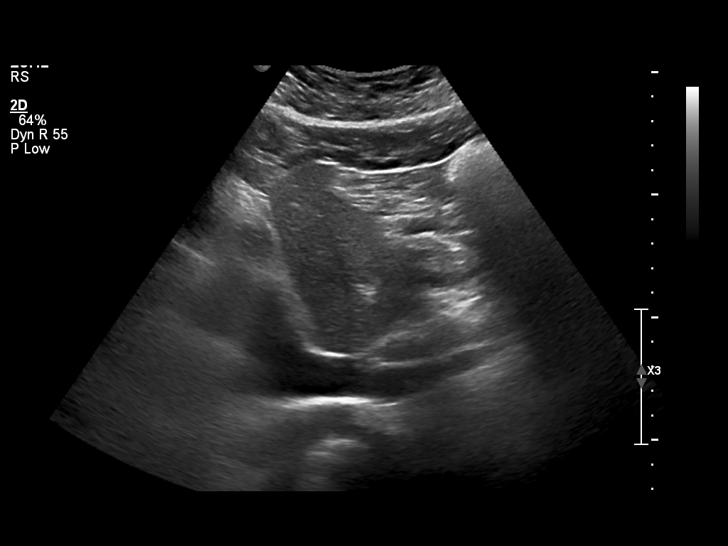
[im 18/47]
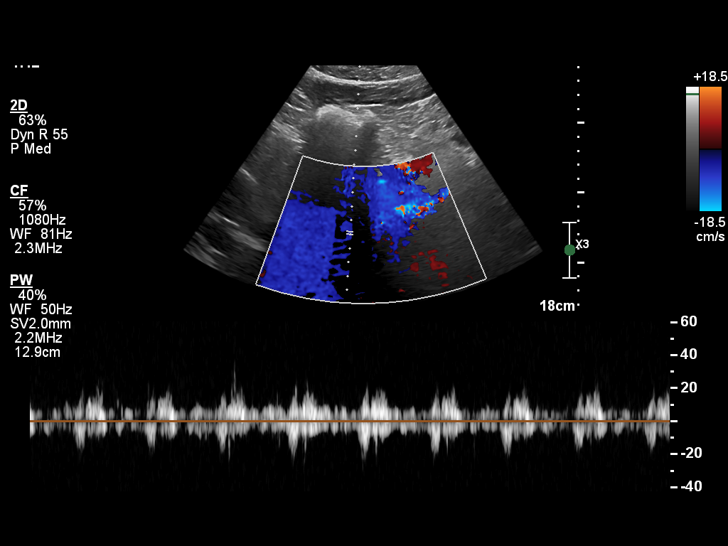
[im 22/47]
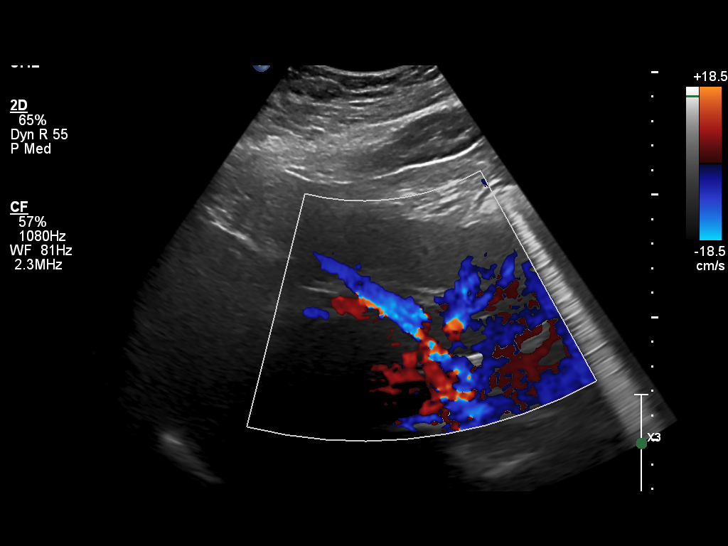
[im 25/47]
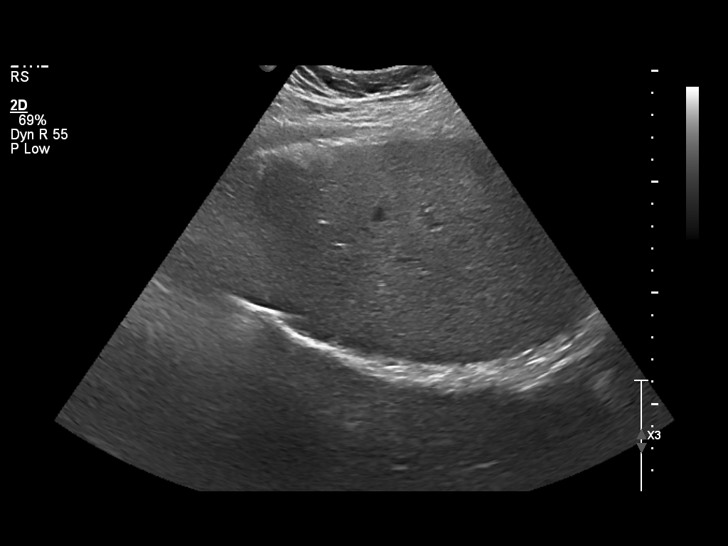
[im 29/47]
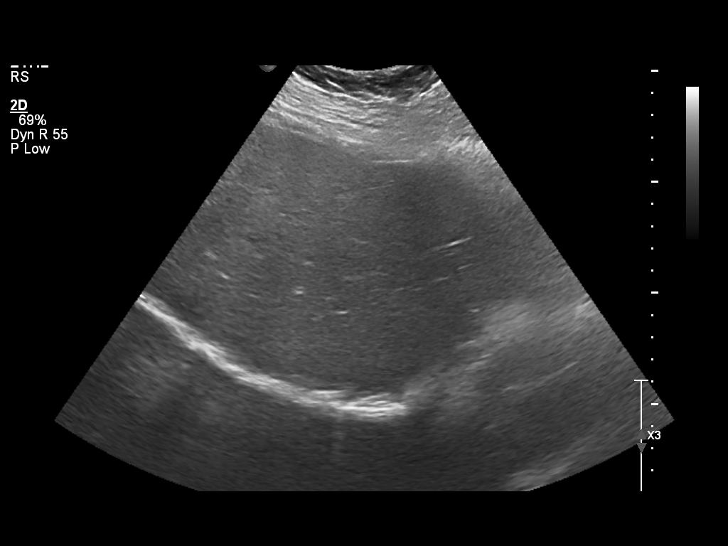
[im 31/47]
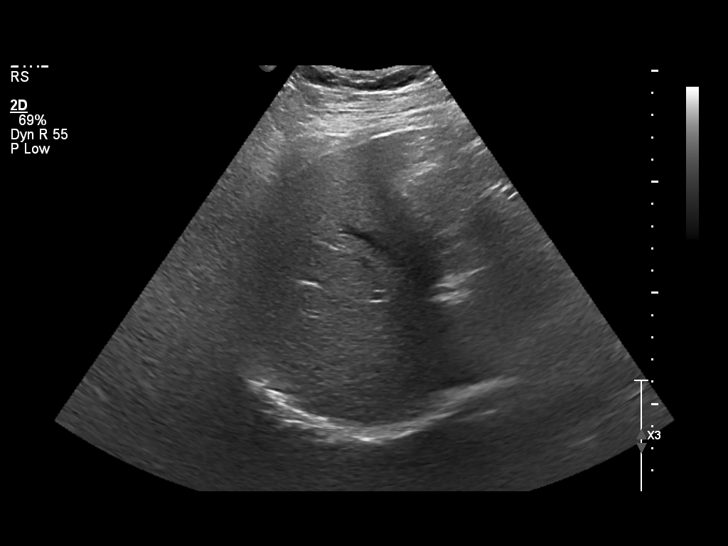
[im 35/47]
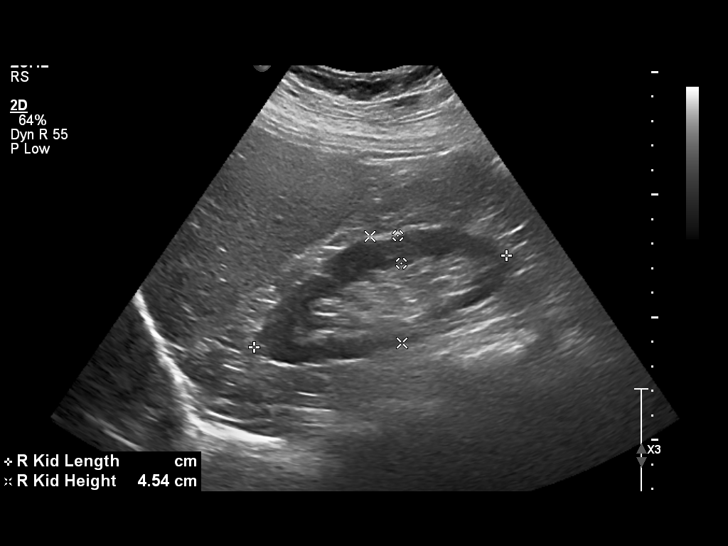
[im 39/47]
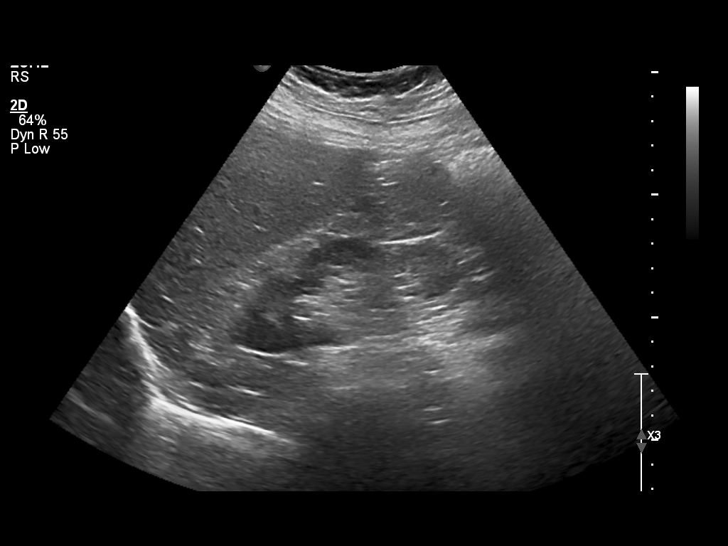
[im 43/47]
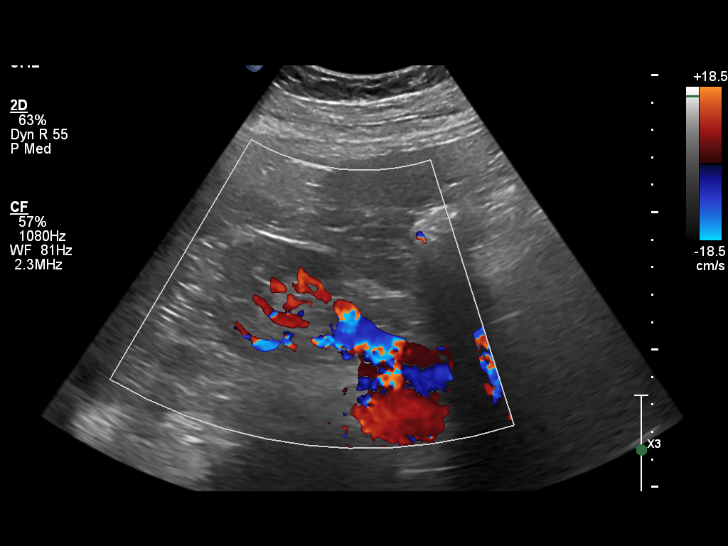
[im 47/47]
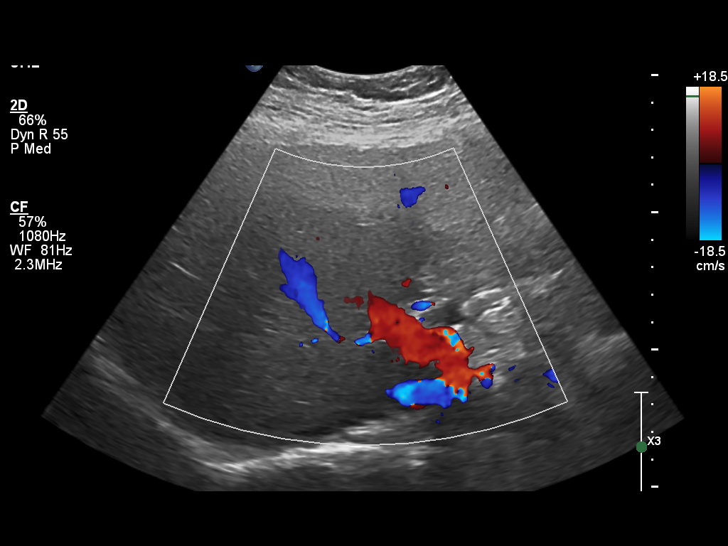

[14 of 25 positions shown; findings below may reference images not displayed]

DIAGNOSTIC STUDIES

EXAM

Abdominal ultrasound

INDICATION

elevated lft
elevated LFT's

TECHNIQUE

Limited abdominal ultrasound was obtained.

COMPARISONS

None available

FINDINGS

Visualized aorta and pancreas are within normal limits.

Prior cholecystectomy.

Liver is normal. Common bile duct measures 6.7 millimeters.

Right kidney is normal measuring 11 cm length. No hydronephrosis.

IMPRESSION

Negative limited abdominal ultrasound. Prior cholecystectomy.

Tech Notes:

elevated LFT's
